# Patient Record
Sex: Male | Born: 1985 | Race: White | Hispanic: No | Marital: Married | Smoking: Never smoker
Health system: Southern US, Community
[De-identification: ages and names within clinical notes are randomized; demographics above are authoritative.]

## PROBLEM LIST (undated history)

## (undated) DIAGNOSIS — I10 Essential (primary) hypertension: Secondary | ICD-10-CM

## (undated) HISTORY — PX: ANTERIOR CRUCIATE LIGAMENT REPAIR: SHX115

## (undated) HISTORY — PX: SHOULDER ARTHROSCOPY W/ LABRAL REPAIR: SHX2399

## (undated) HISTORY — PX: HERNIA REPAIR: SHX51

---

## 2015-02-25 ENCOUNTER — Ambulatory Visit (INDEPENDENT_AMBULATORY_CARE_PROVIDER_SITE_OTHER): Payer: Self-pay | Admitting: Family Medicine

## 2015-02-25 VITALS — BP 120/75 | HR 66 | Temp 97.7°F | Resp 16 | Ht 71.0 in | Wt 182.0 lb

## 2015-02-25 DIAGNOSIS — H9202 Otalgia, left ear: Secondary | ICD-10-CM

## 2015-02-25 DIAGNOSIS — R51 Headache: Secondary | ICD-10-CM

## 2015-02-25 DIAGNOSIS — J0100 Acute maxillary sinusitis, unspecified: Secondary | ICD-10-CM

## 2015-02-25 DIAGNOSIS — R519 Headache, unspecified: Secondary | ICD-10-CM

## 2015-02-25 MED ORDER — AMOXICILLIN 500 MG PO CAPS: 500.0000 mg | ORAL_CAPSULE | Freq: Three times a day (TID) | ORAL | Status: DC

## 2015-02-25 NOTE — Progress Notes (Signed)
 Chief Complaint:  Chief Complaint  Patient presents with  . Headache    X 2 weeks, becoming worse  . Sinus Congestion    X this past weekend  . Otalgia    Bilateral, X past weekend    HPI: Troy Nguyen is a 30 y.o. male who reports to [facility] today complaining of diffuse intermittent frontal HA for the last 2 weeks .  Has had some right ear pain , he ahs has some nasal congestion.  He was in the [employer] and is now studying to get into medical school.  He denies any confusion, gait changes, n.vabd pain, ringing in the ears, vision cahnges except for bialteral eye pain along the nasal area. No light or noise seensitivity,, no rashes, no neck pain  He has not had any confusion  He has a borther who was dx with osteosarcoma which was dx 5 years ago, he is doing well. So is a little ocncerned if anything more.  He was in the [employer] and is now studying to get into medical school.   History reviewed. No pertinent past medical history. Past Surgical History  Procedure Laterality Date  . Anterior cruciate ligament repair Right   . Shoulder arthroscopy w/ labral repair     Social History   Social History  . Marital Status: Married    Spouse Name: N/A  . Number of Children: N/A  . Years of Education: N/A   Social History Main Topics  . Smoking status: Never Smoker   . Smokeless tobacco: None  . Alcohol Use: 0.0 oz/week    0 Standard drinks or equivalent per week  . Drug Use: None  . Sexual Activity: Not Asked   Other Topics Concern  . None   Social History Narrative  . None   Family History  Problem Relation Age of Onset  . Hyperlipidemia Father   . Hypertension Brother   . Cancer Brother   . Cancer Maternal Grandfather   . Cancer Paternal Grandmother    No Known Allergies Prior to Admission medications   Not on File     ROS: The patient denies fevers, chills, night sweats, unintentional weight loss, chest pain, palpitations, wheezing, dyspnea on exertion,  nausea, vomiting, abdominal pain, dysuria, hematuria, melena, numbness, weakness, or tingling.   All other systems have been reviewed and were otherwise negative with the exception of those mentioned in the HPI and as above.    PHYSICAL EXAM: Filed Vitals:   02/25/15 1118  BP: 120/75  Pulse: 66  Temp: 97.7 F (36.5 C)  Resp: 16   Body mass index is 25.4 kg/(m^2).   General: Alert, no acute distress HEENT:  Normocephalic, atraumatic, oropharynx patent. EOMI, PERRLA, fundoscopic exam normal  Erythematous throat, no exudates, TM normal, + sinus tenderness, + erythematous/boggy nasal mucosa\ Cardiovascular:  Regular rate and rhythm, no rubs murmurs or gallops.  No Carotid bruits, radial pulse intact. No pedal edema.  Respiratory: Clear to auscultation bilaterally.  No wheezes, rales, or rhonchi.  No cyanosis, no use of accessory musculature Abdominal: No organomegaly, abdomen is soft and non-tender, positive bowel sounds. No masses. Skin: No rashes. Neurologic: Facial musculature symmetric. CN 2-12 grossly normal  Psychiatric: Patient acts appropriately throughout our interaction. Lymphatic: No cervical or submandibular lymphadenopathy Musculoskeletal: Gait intact. No edema, tenderness   LABS: No results found for this or any previous visit.   EKG/XRAY:   Primary read interpreted by Dr. Conley Rolls at [facility].   ASSESSMENT/PLAN: Encounter  Diagnoses  Name Primary?  . Acute maxillary sinusitis, recurrence not specified Yes  . Otalgia of left ear   . Acute nonintractable headache, unspecified headache type    Try otc med for HA, ie excedrin or tylenol Rx amoxacillin Push fluids If HA persists then fu for advance imaging studies. Neuro and vision exam normal Precautions given  Fu prn   Gross sideeffects, risk and benefits, and alternatives of medications d/w patient. Patient is aware that all medications have potential sideeffects and we are unable to predict every sideeffect or  drug-drug interaction that may occur.    DO  02/25/2015 1:14 PM

## 2015-02-25 NOTE — Patient Instructions (Signed)

## 2015-03-18 ENCOUNTER — Emergency Department (HOSPITAL_COMMUNITY)
Admission: EM | Admit: 2015-03-18 | Discharge: 2015-03-18 | Disposition: A | Discharge status: Home / Self Care | Payer: Non-veteran care | Attending: Emergency Medicine | Admitting: Emergency Medicine

## 2015-03-18 ENCOUNTER — Encounter (HOSPITAL_COMMUNITY): Payer: Self-pay | Admitting: *Deleted

## 2015-03-18 ENCOUNTER — Other Ambulatory Visit: Payer: Self-pay | Admitting: Emergency Medicine

## 2015-03-18 ENCOUNTER — Emergency Department (HOSPITAL_COMMUNITY): Payer: Non-veteran care

## 2015-03-18 DIAGNOSIS — R1031 Right lower quadrant pain: Secondary | ICD-10-CM | POA: Insufficient documentation

## 2015-03-18 LAB — CBC
HCT: 41.4 % (ref 39.0–52.0)
Hemoglobin: 13.8 g/dL (ref 13.0–17.0)
MCH: 28.7 pg (ref 26.0–34.0)
MCHC: 33.3 g/dL (ref 30.0–36.0)
MCV: 86.1 fL (ref 78.0–100.0)
PLATELETS: 189 10*3/uL (ref 150–400)
RBC: 4.81 MIL/uL (ref 4.22–5.81)
RDW: 12.8 % (ref 11.5–15.5)
WBC: 7.5 10*3/uL (ref 4.0–10.5)

## 2015-03-18 LAB — URINALYSIS, ROUTINE W REFLEX MICROSCOPIC
BILIRUBIN URINE: NEGATIVE
GLUCOSE, UA: NEGATIVE mg/dL
HGB URINE DIPSTICK: NEGATIVE
KETONES UR: NEGATIVE mg/dL
Leukocytes, UA: NEGATIVE
NITRITE: NEGATIVE
PH: 6 (ref 5.0–8.0)
Protein, ur: NEGATIVE mg/dL
SPECIFIC GRAVITY, URINE: 1.042 — AB (ref 1.005–1.030)

## 2015-03-18 LAB — COMPREHENSIVE METABOLIC PANEL
ALK PHOS: 62 U/L (ref 38–126)
ALT: 13 U/L — AB (ref 17–63)
AST: 18 U/L (ref 15–41)
Albumin: 4.7 g/dL (ref 3.5–5.0)
Anion gap: 11 (ref 5–15)
BUN: 19 mg/dL (ref 6–20)
CALCIUM: 10 mg/dL (ref 8.9–10.3)
CHLORIDE: 105 mmol/L (ref 101–111)
CO2: 27 mmol/L (ref 22–32)
CREATININE: 1.18 mg/dL (ref 0.61–1.24)
Glucose, Bld: 121 mg/dL — ABNORMAL HIGH (ref 65–99)
Potassium: 3.6 mmol/L (ref 3.5–5.1)
Sodium: 143 mmol/L (ref 135–145)
Total Bilirubin: 0.5 mg/dL (ref 0.3–1.2)
Total Protein: 6.7 g/dL (ref 6.5–8.1)

## 2015-03-18 LAB — LIPASE, BLOOD: LIPASE: 31 U/L (ref 11–51)

## 2015-03-18 MED ORDER — IOHEXOL 300 MG/ML  SOLN
100.0000 mL | Freq: Once | INTRAMUSCULAR | Status: AC | PRN
  Administered 2015-03-18: 100 mL via INTRAVENOUS

## 2015-03-18 NOTE — ED Notes (Signed)
Discharge instructions reviewed.

## 2015-03-18 NOTE — ED Notes (Signed)
Pt had cmp, cbc and lipase ordered during downtime

## 2015-03-18 NOTE — ED Notes (Signed)
Patient does not want anything for pain at this time.

## 2015-03-18 NOTE — ED Notes (Addendum)
Per downtime triage assessment record: Pt c/o right sided abdominal pain, nausea, swelling to hands, difficulty sleeping and difficulty concentrating. Pt states his wife has told him his breath smells "metallic"

## 2015-03-18 NOTE — Discharge Instructions (Signed)
Abdominal Pain, Adult Mr. Troy Nguyen, your CT scan and blood work did not show any cause for your abdominal pain.  See your primary care doctor within 3 days for close follow up.  If symptoms worsen, come back to the ED immediately.  Thank you. Many things can cause belly (abdominal) pain. Most times, the belly pain is not dangerous. Many cases of belly pain can be watched and treated at home. HOME CARE   Do not take medicines that help you go poop (laxatives) unless told to by your doctor.  Only take medicine as told by your doctor.  Eat or drink as told by your doctor. Your doctor will tell you if you should be on a special diet. GET HELP IF:  You do not know what is causing your belly pain.  You have belly pain while you are sick to your stomach (nauseous) or have runny poop (diarrhea).  You have pain while you pee or poop.  Your belly pain wakes you up at night.  You have belly pain that gets worse or better when you eat.  You have belly pain that gets worse when you eat fatty foods.  You have a fever. GET HELP RIGHT AWAY IF:   The pain does not go away within 2 hours.  You keep throwing up (vomiting).  The pain changes and is only in the right or left part of the belly.  You have bloody or tarry looking poop. MAKE SURE YOU:   Understand these instructions.  Will watch your condition.  Will get help right away if you are not doing well or get worse.   This information is not intended to replace advice given to you by your health care provider. Make sure you discuss any questions you have with your health care provider.   Document Released: 07/13/2007 Document Revised: 02/14/2014 Document Reviewed: 10/03/2012 Elsevier Interactive Patient Education [employer]

## 2015-03-18 NOTE — ED Provider Notes (Signed)
CSN: [id number]     Arrival date & time 03/18/15  0044 History   First MD Initiated Contact with Patient 03/18/15 0451     Chief Complaint  Patient presents with  . Abdominal Pain     (Consider location/radiation/quality/duration/timing/severity/associated sxs/prior Treatment) HPI  Troy Nguyen is a 30 y.o. male with no significant past medical history presenting today with abdominal pain. Patient states his pain is in the right lower quadrant. Is going on for one day. He has had nausea without vomiting, he denies diarrhea. He's had no urinary complaints. He states the pain is sharp and at times radiates to his back. He denies ever having symptoms like this in the past. He has associated swelling of his hands, difficulty sleeping, and difficulty concentrating. Those symptoms have been going on for the past 2 weeks prior to his abdominal pain. He has a primary care appointment for this week, but presents for worsening abdominal pain tonight. He has no further complaints.  10 Systems reviewed and are negative for acute change except as noted in the HPI.      History reviewed. No pertinent past medical history. Past Surgical History  Procedure Laterality Date  . Anterior cruciate ligament repair Right   . Shoulder arthroscopy w/ labral repair     Family History  Problem Relation Age of Onset  . Hyperlipidemia Father   . Hypertension Brother   . Cancer Brother   . Cancer Maternal Grandfather   . Cancer Paternal Grandmother    Social History  Substance Use Topics  . Smoking status: Never Smoker   . Smokeless tobacco: None  . Alcohol Use: 0.0 oz/week    0 Standard drinks or equivalent per week    Review of Systems    Allergies  Review of patient's allergies indicates no known allergies.  Home Medications   Prior to Admission medications   Not on File   BP 157/95 mmHg  Pulse 93  Temp(Src) 97.7 F (36.5 C) (Oral)  Resp 21  SpO2 98% Physical Exam  Constitutional: He is  oriented to person, place, and time. Vital signs are normal. He appears well-developed and well-nourished.  Non-toxic appearance. He does not appear ill. No distress.  HENT:  Head: Normocephalic and atraumatic.  Nose: Nose normal.  Mouth/Throat: Oropharynx is clear and moist. No oropharyngeal exudate.  Eyes: Conjunctivae and EOM are normal. Pupils are equal, round, and reactive to light. No scleral icterus.  Neck: Normal range of motion. Neck supple. No tracheal deviation, no edema, no erythema and normal range of motion present. No thyroid mass and no thyromegaly present.  Cardiovascular: Normal rate, regular rhythm, S1 normal, S2 normal, normal heart sounds, intact distal pulses and normal pulses.  Exam reveals no gallop and no friction rub.   No murmur heard. Pulmonary/Chest: Effort normal and breath sounds normal. No respiratory distress. He has no wheezes. He has no rhonchi. He has no rales.  Abdominal: Soft. Normal appearance and bowel sounds are normal. He exhibits no distension, no ascites and no mass. There is no hepatosplenomegaly. There is no tenderness. There is no rebound, no guarding and no CVA tenderness.  Musculoskeletal: Normal range of motion. He exhibits no edema or tenderness.  Lymphadenopathy:    He has no cervical adenopathy.  Neurological: He is alert and oriented to person, place, and time. He has normal strength. No cranial nerve deficit or sensory deficit.  Skin: Skin is warm, dry and intact. No petechiae and no rash noted. He is not  diaphoretic. No erythema. No pallor.  Psychiatric: He has a normal mood and affect. His behavior is normal. Judgment normal.  Nursing note and vitals reviewed.   ED Course  Procedures (including critical care time) Labs Review Labs Reviewed  URINALYSIS, ROUTINE W REFLEX MICROSCOPIC (NOT AT [facility])    Imaging Review Ct Abdomen Pelvis W Contrast  03/18/2015  CLINICAL DATA:  Acute onset of right-sided abdominal pain and bilateral hand  swelling. Nausea. Insomnia. Initial encounter. EXAM: CT ABDOMEN AND PELVIS WITH CONTRAST TECHNIQUE: Multidetector CT imaging of the abdomen and pelvis was performed using the standard protocol following bolus administration of intravenous contrast. CONTRAST:  OMNIPAQUE IOHEXOL 300 MG/ML  SOLN COMPARISON:  None. FINDINGS: The visualized lung bases are clear. The liver is unremarkable in appearance. The spleen is mildly enlarged, measuring 13.9 cm in length. The gallbladder is within normal limits. The pancreas and adrenal glands are unremarkable. The kidneys are unremarkable in appearance. There is no evidence of hydronephrosis. No renal or ureteral stones are seen. No perinephric stranding is appreciated. No free fluid is identified. The small bowel is unremarkable in appearance. The stomach is within normal limits. No acute vascular abnormalities are seen. The appendix is normal in caliber, without evidence of appendicitis. The colon is unremarkable in appearance. The bladder is mildly distended and grossly unremarkable in appearance. The prostate remains normal in size. No inguinal lymphadenopathy is seen. No acute osseous abnormalities are identified. IMPRESSION: 1. No acute abnormality seen within the abdomen or pelvis. 2. Mild splenomegaly noted. Electronically Signed   By: Roanna Raider M.D.   On: 03/18/2015 06:09   I have personally reviewed and evaluated these images and lab results as part of my medical decision-making.   EKG Interpretation None      MDM   Final diagnoses:  None    Patient presents to the emergency department for abdominal pain. Labs are unremarkable. Will obtain CT scan to evaluate for appendicitis. He is not requesting anything for pain control.  CT scan is unremarkable.  Patient continues to appears well and in NAD.  His VS remain within his normal limits and he is safe for DC.    Tomasita Crumble, MD 03/18/15 [id number]

## 2015-03-18 NOTE — ED Notes (Signed)
Patient states he has not been feeling well over the past few days noticing pain to the right lower abd with pain radiating to the right side.  Denies painful urination.  Also reports wife states he has a metallic smell to his breath

## 2015-10-08 ENCOUNTER — Encounter: Payer: Self-pay | Admitting: Gastroenterology

## 2015-10-23 ENCOUNTER — Other Ambulatory Visit: Payer: Self-pay | Admitting: Family Medicine

## 2015-10-23 DIAGNOSIS — R1909 Other intra-abdominal and pelvic swelling, mass and lump: Secondary | ICD-10-CM

## 2015-10-26 ENCOUNTER — Ambulatory Visit
Admission: RE | Admit: 2015-10-26 | Discharge: 2015-10-26 | Disposition: A | Discharge status: Home / Self Care | Payer: Managed Care, Other (non HMO) | Source: Ambulatory Visit | Attending: Family Medicine | Admitting: Family Medicine

## 2015-10-26 DIAGNOSIS — R1909 Other intra-abdominal and pelvic swelling, mass and lump: Secondary | ICD-10-CM

## 2015-10-28 ENCOUNTER — Other Ambulatory Visit: Payer: Self-pay | Admitting: Family Medicine

## 2015-10-28 DIAGNOSIS — R1909 Other intra-abdominal and pelvic swelling, mass and lump: Secondary | ICD-10-CM

## 2015-11-10 ENCOUNTER — Ambulatory Visit
Admission: RE | Admit: 2015-11-10 | Discharge: 2015-11-10 | Disposition: A | Discharge status: Home / Self Care | Payer: Managed Care, Other (non HMO) | Source: Ambulatory Visit | Attending: Family Medicine | Admitting: Family Medicine

## 2015-11-10 DIAGNOSIS — R1909 Other intra-abdominal and pelvic swelling, mass and lump: Secondary | ICD-10-CM

## 2015-11-10 MED ORDER — IOPAMIDOL (ISOVUE-300) INJECTION 61%
100.0000 mL | Freq: Once | INTRAVENOUS | Status: AC | PRN
  Administered 2015-11-10: 100 mL via INTRAVENOUS

## 2015-11-10 MED ORDER — IOHEXOL 300 MG/ML  SOLN
30.0000 mL | Freq: Once | INTRAMUSCULAR | Status: AC | PRN
  Administered 2015-11-10: 30 mL via ORAL

## 2015-12-18 ENCOUNTER — Ambulatory Visit: Payer: No Typology Code available for payment source | Admitting: Gastroenterology

## 2016-07-21 ENCOUNTER — Ambulatory Visit (HOSPITAL_COMMUNITY)
Admission: EM | Admit: 2016-07-21 | Discharge: 2016-07-21 | Disposition: A | Discharge status: Home / Self Care | Payer: 59 | Attending: Internal Medicine | Admitting: Internal Medicine

## 2016-07-21 ENCOUNTER — Encounter (HOSPITAL_COMMUNITY): Payer: Self-pay | Admitting: Emergency Medicine

## 2016-07-21 DIAGNOSIS — L600 Ingrowing nail: Secondary | ICD-10-CM

## 2016-07-21 HISTORY — DX: Essential (primary) hypertension: I10

## 2016-07-21 MED ORDER — IBUPROFEN 800 MG PO TABS: 800.0000 mg | ORAL_TABLET | Freq: Three times a day (TID) | ORAL | 0 refills | Status: AC | PRN

## 2016-07-21 MED ORDER — AMOXICILLIN-POT CLAVULANATE 875-125 MG PO TABS: 1.0000 | ORAL_TABLET | Freq: Two times a day (BID) | ORAL | 0 refills | Status: AC

## 2016-07-21 NOTE — ED Triage Notes (Signed)
Pt reports pulling some skin from his right great toe on Monday.  He now has redness and swelling in the area and he is concerned for infection.

## 2016-07-21 NOTE — ED Provider Notes (Signed)
CSN: [id number]     Arrival date & time 07/21/16  1732 History   None    Chief Complaint  Patient presents with  . Ingrown Toenail   (Consider location/radiation/quality/duration/timing/severity/associated sxs/prior Treatment) Patient c/o ingrown toenail right first toe.   The history is provided by the patient.  Toe Pain  This is a new problem. The problem occurs constantly. The problem has not changed since onset.Nothing aggravates the symptoms. Nothing relieves the symptoms.    Past Medical History:  Diagnosis Date  . Hypertension    Past Surgical History:  Procedure Laterality Date  . ANTERIOR CRUCIATE LIGAMENT REPAIR Right   . HERNIA REPAIR    . SHOULDER ARTHROSCOPY W/ LABRAL REPAIR     Family History  Problem Relation Age of Onset  . Hyperlipidemia Father   . Hypertension Brother   . Cancer Brother   . Cancer Maternal Grandfather   . Cancer Paternal Grandmother    Social History  Substance Use Topics  . Smoking status: Never Smoker  . Smokeless tobacco: Former [employer]    Types: Chew  . Alcohol use 0.0 oz/week    Review of Systems  Constitutional: Negative.   HENT: Negative.   Eyes: Negative.   Respiratory: Negative.   Cardiovascular: Negative.   Gastrointestinal: Negative.   Endocrine: Negative.   Genitourinary: Negative.   Musculoskeletal: Negative.   Skin: Positive for wound.  Allergic/Immunologic: Negative.   Neurological: Negative.   Hematological: Negative.   Psychiatric/Behavioral: Negative.     Allergies  Patient has no known allergies.  Home Medications   Prior to Admission medications   Medication Sig Start Date End Date Taking? Authorizing Provider  amoxicillin-clavulanate (AUGMENTIN) 875-125 MG tablet Take 1 tablet by mouth 2 (two) times daily. 07/21/16   Deatra Canterxford, William J, FNP  ibuprofen (ADVIL,MOTRIN) 800 MG tablet Take 1 tablet (800 mg total) by mouth every 8 (eight) hours as needed. 07/21/16   Deatra Canterxford, William J, FNP   Meds Ordered  and Administered this Visit  Medications - No data to display  BP (!) 153/88 (BP Location: Left Arm)   Pulse 78   Temp 98.2 F (36.8 C) (Oral)   SpO2 100%  No data found.   Physical Exam  Constitutional: He appears well-developed and well-nourished.  HENT:  Head: Normocephalic and atraumatic.  Eyes: Conjunctivae and EOM are normal. Pupils are equal, round, and reactive to light.  Neck: Normal range of motion. Neck supple.  Cardiovascular: Normal rate, regular rhythm and normal heart sounds.   Pulmonary/Chest: Effort normal and breath sounds normal.  Skin: There is erythema.  Right first toe ingrown lateral edge.  Mild TTP right toe.  Nursing note and vitals reviewed.   Urgent Care Course     Procedures (including critical care time)  Labs Review Labs Reviewed - No data to display  Imaging Review No results found.   Visual Acuity Review  Right Eye Distance:   Left Eye Distance:   Bilateral Distance:    Right Eye Near:   Left Eye Near:    Bilateral Near:         MDM   1. Ingrown toenail    Augmentin 875mg  one po bid x 10 days Epson Salt and warm water soaks  Follow up prn      Deatra CanterOxford, William J, FNP 07/21/16 1847

## 2018-08-22 IMAGING — CT CT ABD-PELV W/ CM
2 of 4 series · 10 of 36 positions shown, 17 images · IV contrast (omnipaque)
Comparison: CT scan of March 18, 2015.

CLINICAL DATA: Right lower quadrant abdominal hernia.

EXAM:
CT ABDOMEN AND PELVIS WITH CONTRAST
TECHNIQUE: Multidetector CT imaging of the abdomen and pelvis was performed
using the standard protocol following bolus administration of
intravenous contrast.
CONTRAST:  30mL OMNIPAQUE IOHEXOL 300 MG/ML SOLN, 100mL 1VAEAK-555
IOPAMIDOL (1VAEAK-555) INJECTION 61%

[Series 3: abd/pelvis with · axial · 0.73mm/px · z∈[-373,-3]mm · 9 of 89 slices shown, 15 images]
[im 9/89  soft-tissue]
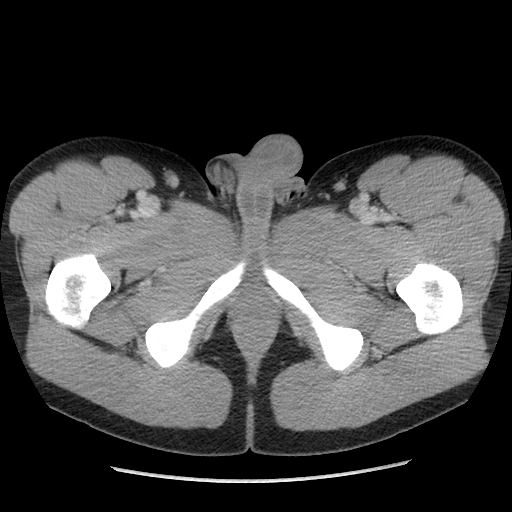
[im 9/89  bone]
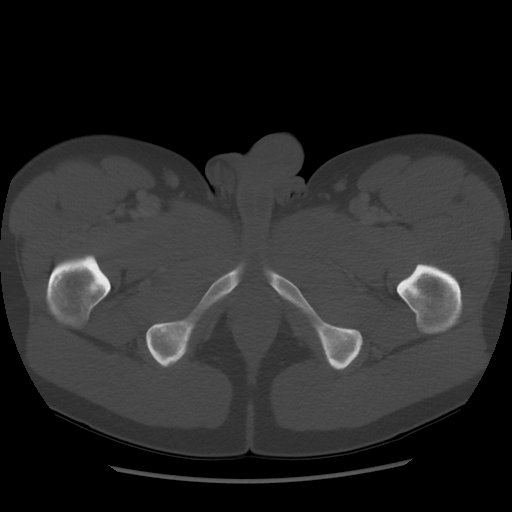
[im 18/89  soft-tissue]
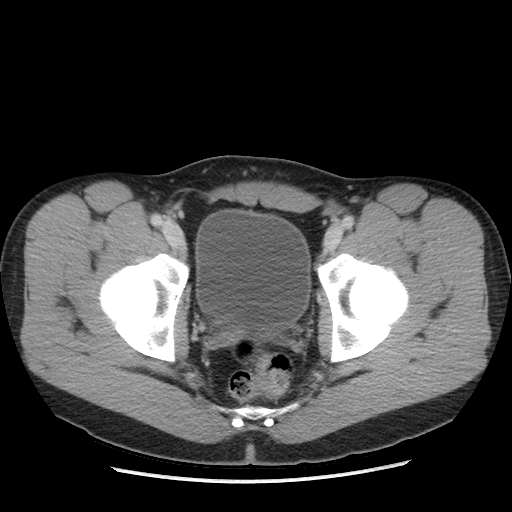
[im 27/89  soft-tissue]
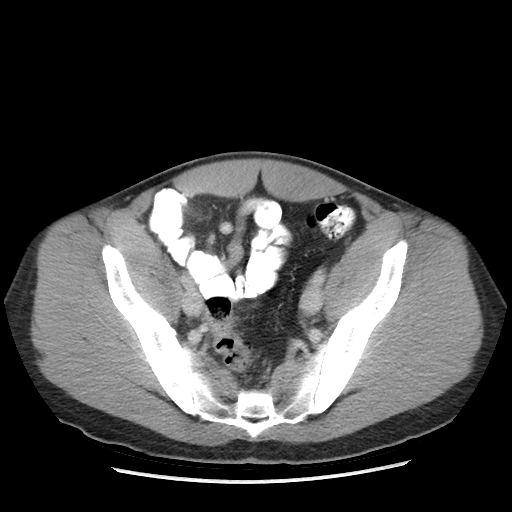
[im 36/89  soft-tissue]
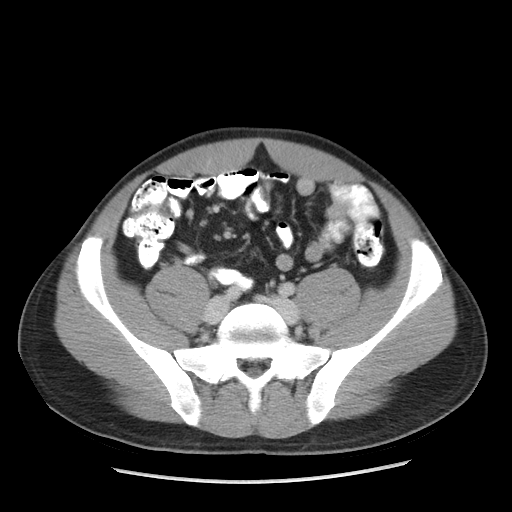
[im 45/89  soft-tissue]
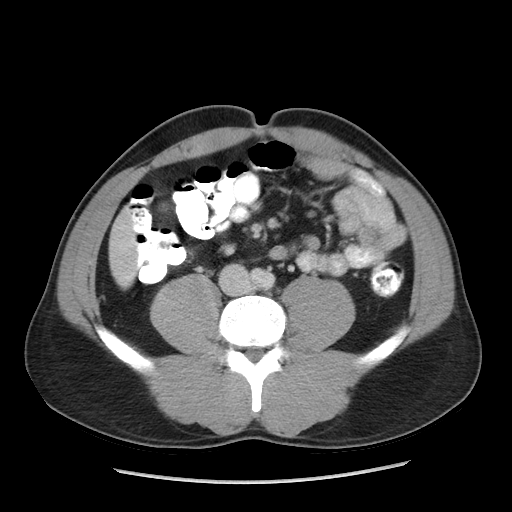
[im 53/89  soft-tissue]
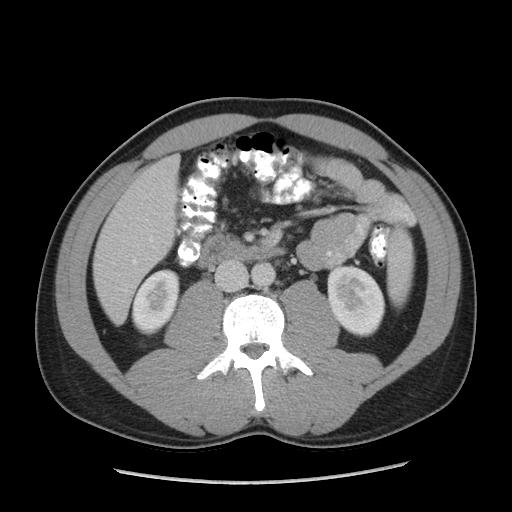
[im 53/89  lung]
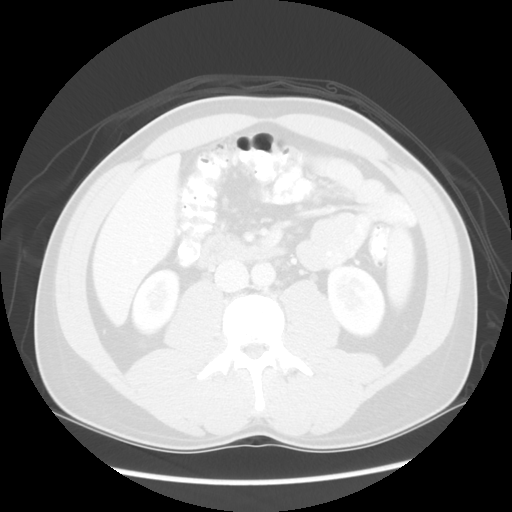
[im 62/89  soft-tissue]
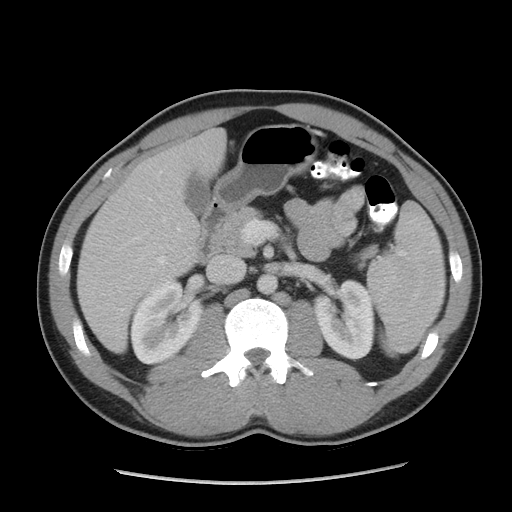
[im 62/89  lung]
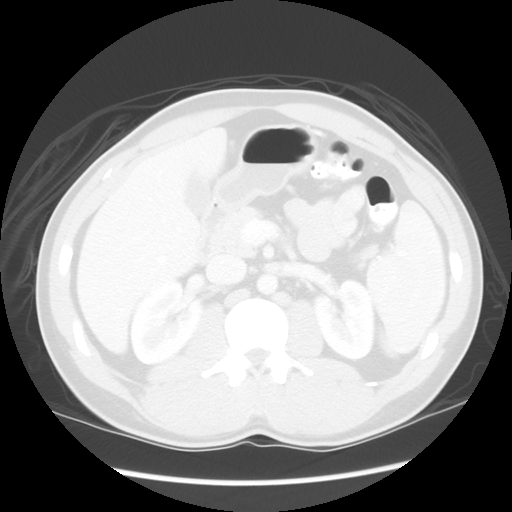
[im 71/89  soft-tissue]
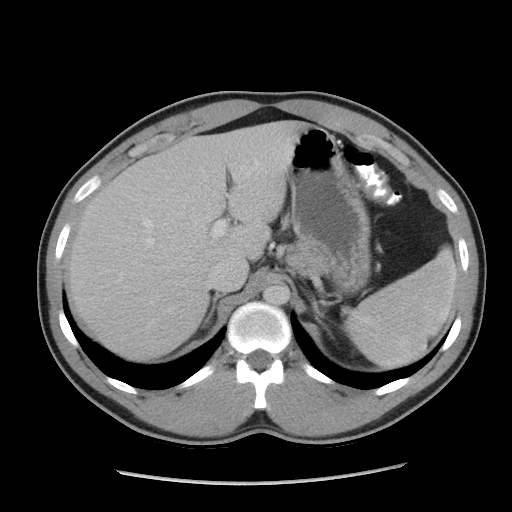
[im 71/89  lung]
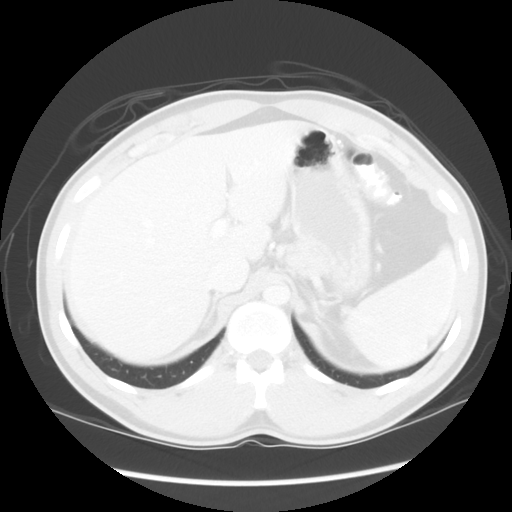
[im 80/89  soft-tissue]
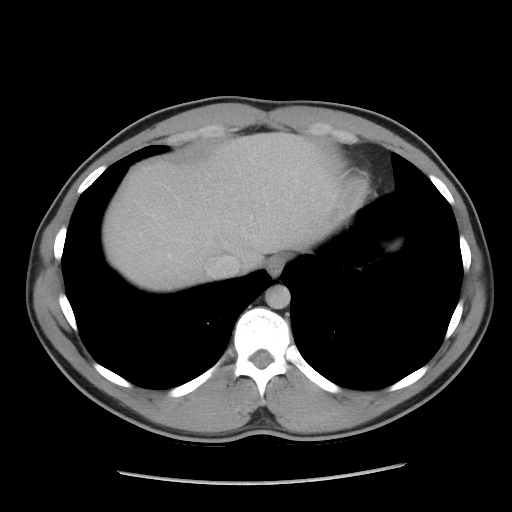
[im 80/89  lung]
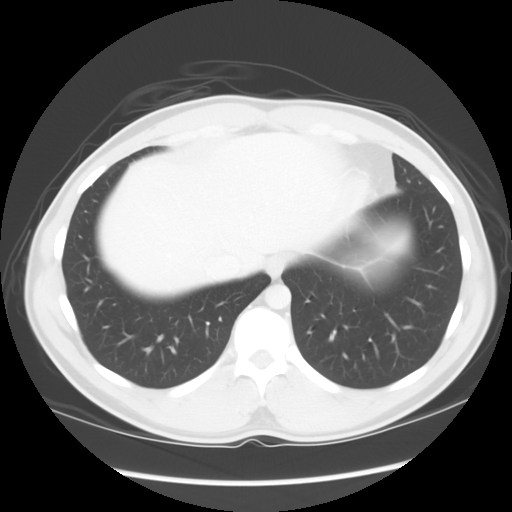
[im 80/89  bone]
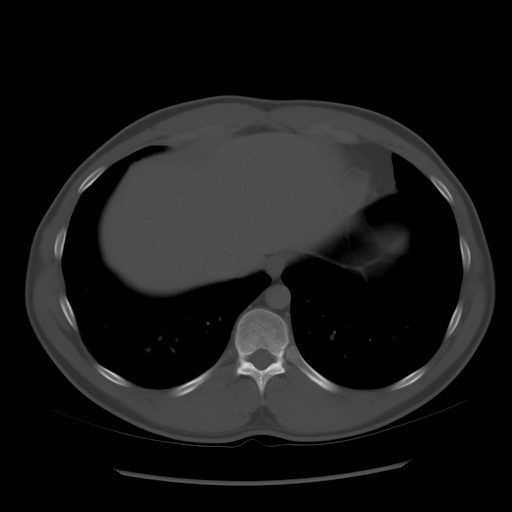

[Series 601: coronal body · coronal · 0.95mm/px · 1 of 113 slices shown, 2 images]
[im 38/113  soft-tissue]
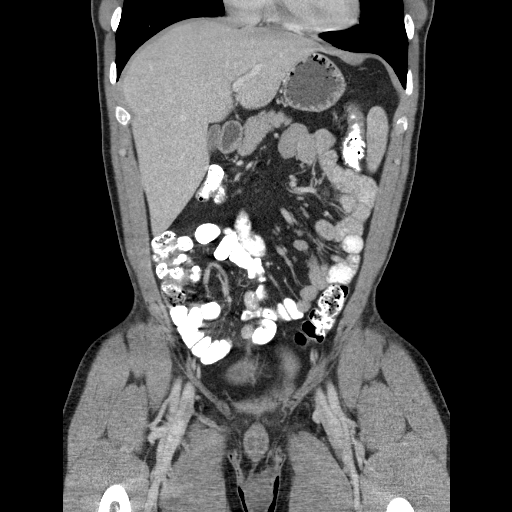
[im 38/113  bone]
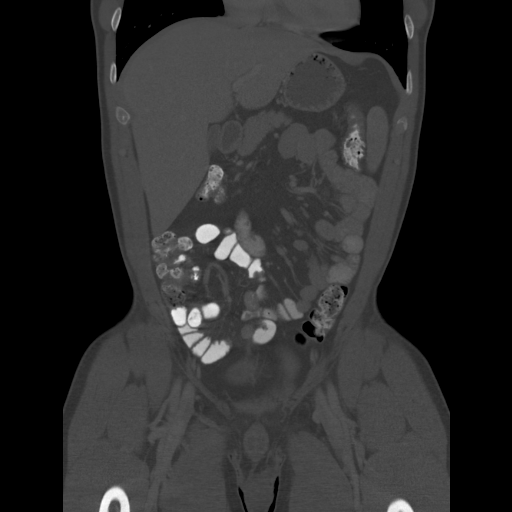

[10 of 36 positions shown; findings below may reference images not displayed]

FINDINGS: Lower chest: Visualized lung bases are unremarkable.

Hepatobiliary: No gallstones are noted.  The liver appears normal.

Pancreas: Normal.

Spleen: Normal.

Adrenals/Urinary Tract: Adrenal glands and kidneys appear normal. No
hydronephrosis or renal obstruction is noted. Urinary bladder
appears normal.

Stomach/Bowel: There is no evidence of bowel obstruction. The
appendix appears normal.

Vascular/Lymphatic: No significant adenopathy is noted.

Reproductive: Prostate gland appears normal.

Other: No abnormal fluid collection is noted. No definite hernia is
noted.

Musculoskeletal: No significant osseous abnormality is noted.
IMPRESSION: No definite abnormality seen in the abdomen or pelvis.
